# Patient Record
Sex: Male | Born: 1980 | Race: White | Hispanic: No | Marital: Married | Smoking: Never smoker
Health system: Southern US, Community
[De-identification: ages and names within clinical notes are randomized; demographics above are authoritative.]

## PROBLEM LIST (undated history)

## (undated) DIAGNOSIS — S92902A Unspecified fracture of left foot, initial encounter for closed fracture: Secondary | ICD-10-CM

## (undated) DIAGNOSIS — E785 Hyperlipidemia, unspecified: Secondary | ICD-10-CM

## (undated) DIAGNOSIS — J45909 Unspecified asthma, uncomplicated: Secondary | ICD-10-CM

## (undated) DIAGNOSIS — J309 Allergic rhinitis, unspecified: Secondary | ICD-10-CM

## (undated) HISTORY — DX: Unspecified fracture of left foot, initial encounter for closed fracture: S92.902A

## (undated) HISTORY — DX: Unspecified asthma, uncomplicated: J45.909

## (undated) HISTORY — DX: Allergic rhinitis, unspecified: J30.9

## (undated) HISTORY — DX: Hyperlipidemia, unspecified: E78.5

---

## 1999-10-13 DIAGNOSIS — S92902A Unspecified fracture of left foot, initial encounter for closed fracture: Secondary | ICD-10-CM

## 1999-10-13 HISTORY — DX: Unspecified fracture of left foot, initial encounter for closed fracture: S92.902A

## 2004-03-13 ENCOUNTER — Emergency Department (HOSPITAL_COMMUNITY): Admission: EM | Admit: 2004-03-13 | Discharge: 2004-03-13 | Payer: Self-pay | Admitting: Family Medicine

## 2005-07-31 ENCOUNTER — Ambulatory Visit: Payer: Self-pay | Admitting: Family Medicine

## 2005-08-31 ENCOUNTER — Ambulatory Visit: Payer: Self-pay | Admitting: Family Medicine

## 2005-08-31 LAB — CONVERTED CEMR LAB
RBC count: 4.88 10*6/uL
TSH: 1.52 microintl units/mL
WBC, blood: 6.5 10*3/uL

## 2006-01-29 ENCOUNTER — Ambulatory Visit: Payer: Self-pay | Admitting: Family Medicine

## 2006-08-12 ENCOUNTER — Ambulatory Visit: Payer: Self-pay | Admitting: Family Medicine

## 2006-11-04 ENCOUNTER — Ambulatory Visit: Payer: Self-pay | Admitting: Family Medicine

## 2006-11-18 ENCOUNTER — Ambulatory Visit: Payer: Self-pay | Admitting: Family Medicine

## 2007-02-12 ENCOUNTER — Emergency Department (HOSPITAL_COMMUNITY): Admission: EM | Admit: 2007-02-12 | Discharge: 2007-02-12 | Payer: Self-pay | Admitting: Emergency Medicine

## 2007-03-21 ENCOUNTER — Telehealth: Payer: Self-pay | Admitting: Family Medicine

## 2008-03-14 ENCOUNTER — Ambulatory Visit: Payer: Self-pay | Admitting: Family Medicine

## 2008-03-14 DIAGNOSIS — K224 Dyskinesia of esophagus: Secondary | ICD-10-CM | POA: Insufficient documentation

## 2008-03-20 ENCOUNTER — Ambulatory Visit: Payer: Self-pay | Admitting: Family Medicine

## 2008-03-21 ENCOUNTER — Encounter: Payer: Self-pay | Admitting: Family Medicine

## 2008-08-20 ENCOUNTER — Ambulatory Visit: Payer: Self-pay | Admitting: Family Medicine

## 2008-08-20 ENCOUNTER — Encounter: Payer: Self-pay | Admitting: Family Medicine

## 2008-08-20 DIAGNOSIS — F411 Generalized anxiety disorder: Secondary | ICD-10-CM | POA: Insufficient documentation

## 2008-08-20 DIAGNOSIS — E663 Overweight: Secondary | ICD-10-CM | POA: Insufficient documentation

## 2008-08-20 DIAGNOSIS — R7309 Other abnormal glucose: Secondary | ICD-10-CM | POA: Insufficient documentation

## 2008-08-20 DIAGNOSIS — E785 Hyperlipidemia, unspecified: Secondary | ICD-10-CM

## 2008-09-20 ENCOUNTER — Ambulatory Visit: Payer: Self-pay | Admitting: Family Medicine

## 2008-11-05 ENCOUNTER — Ambulatory Visit: Payer: Self-pay | Admitting: Family Medicine

## 2008-11-05 DIAGNOSIS — R1013 Epigastric pain: Secondary | ICD-10-CM

## 2008-12-31 ENCOUNTER — Ambulatory Visit: Payer: Self-pay | Admitting: Family Medicine

## 2009-07-24 ENCOUNTER — Ambulatory Visit: Payer: Self-pay | Admitting: Family Medicine

## 2009-07-24 DIAGNOSIS — M25519 Pain in unspecified shoulder: Secondary | ICD-10-CM | POA: Insufficient documentation

## 2009-07-24 DIAGNOSIS — M67919 Unspecified disorder of synovium and tendon, unspecified shoulder: Secondary | ICD-10-CM | POA: Insufficient documentation

## 2009-07-24 DIAGNOSIS — M719 Bursopathy, unspecified: Secondary | ICD-10-CM

## 2009-08-28 ENCOUNTER — Encounter: Payer: Self-pay | Admitting: Family Medicine

## 2009-09-11 ENCOUNTER — Ambulatory Visit: Payer: Self-pay | Admitting: Family Medicine

## 2009-10-10 ENCOUNTER — Telehealth: Payer: Self-pay | Admitting: Family Medicine

## 2010-05-20 ENCOUNTER — Encounter (INDEPENDENT_AMBULATORY_CARE_PROVIDER_SITE_OTHER): Payer: Self-pay | Admitting: *Deleted

## 2010-10-22 ENCOUNTER — Ambulatory Visit
Admission: RE | Admit: 2010-10-22 | Discharge: 2010-10-22 | Payer: Self-pay | Source: Home / Self Care | Attending: Family Medicine | Admitting: Family Medicine

## 2010-11-11 NOTE — Letter (Signed)
Summary: Nadara Eaton letter  [place] at [facility]  [address] [place], [place] [address]   Phone: [phone]  Fax: [phone]       05/20/2010 MRN: [id number]  [facility] [address] [place], [place]  [address]  Dear Mr. MCENROE,  [facility], and [facility] announce the retirement of Arta Silence, M.D., from full-time practice at the [facility] office effective April 10, 2010 and his plans of returning part-time.  It is important to Dr. Hetty Ely and to our practice that you understand that [facility] has seven physicians in our office for your health care needs.  We will continue to offer the same exceptional care that you have today.    Dr. Hetty Ely has spoken to many of you about his plans for retirement and returning part-time in the fall.   We will continue to work with you through the transition to schedule appointments for you in the office and meet the high standards that [place] is committed to.   Again, it is with great pleasure that we share the news that Dr. Hetty Ely will return to [facility] at [facility] in October of 2011 with a reduced schedule.    If you have any questions, or would like to request an appointment with one of our physicians, please call us at [phone] and press the option for Scheduling an appointment.  We take pleasure in providing you with excellent patient care and look forward to seeing you at your next office visit.  Our [facility] Physicians are:  Tillman Abide, M.D. Laurita Quint, M.D. Roxy Manns, M.D. Kerby Nora, M.D. Hannah Beat, M.D. Ruthe Mannan, M.D. We proudly welcomed Raechel Ache, M.D. and Eustaquio Boyden, M.D. to the practice in July/August 2011.  Sincerely,  [facility]

## 2010-11-13 NOTE — Assessment & Plan Note (Signed)
Summary: ABD PAIN/CLE   Vital Signs:  Patient profile:   30 year old male Weight:      302.75 pounds BMI:     43.60 Temp:     98.8 degrees F oral Pulse rate:   72 / minute Pulse rhythm:   regular BP sitting:   130 / 90  (left arm) Cuff size:   large  Vitals Entered By: Sydell Axon LPN (October 22, 2010 4:08 PM) CC: abdominal pain on left side of stomach and middle of chest   History of Present Illness: Pt here for "Air pocket pain" in epigastirci area just below the rib, pressure makes it worse, will not go away and sends out a "throb" occas...Marland Kitchenoff and on throughout the day. Then he also has in the center of the epigastric/LED area of "pain" like he hasn't eaten in three days and he has not been hungry. He feels fatigued from one or both.   Problems Prior to Update: 1)  Shoulder Pain, Right  (ICD-719.41) 2)  Rotator Cuff Syndrome, Right  (ICD-726.10) 3)  Abdominal Pain, Epigastric  (ICD-789.06) 4)  Hyperglycemia  (ICD-790.29) 5)  Overweight  (ICD-278.02) 6)  Hyperlipidemia  (ICD-272.4) 7)  Anxiety  (ICD-300.00) 8)  Verruca Vulgaris, Right Occip Scalp  (ICD-078.10) 9)  Esophageal Spasm  (ICD-530.5)  Medications Prior to Update: 1)  Vicodin 5-500 Mg Tabs (Hydrocodone-Acetaminophen) .... One Tab By Mouth Three Times A Day As Needed Pain.  Current Medications (verified): 1)  Zantac 75 75 Mg Tabs (Ranitidine Hcl) .... Take One By Mouth Daily As Needed 2)  Ibuprofen 200 Mg Tabs (Ibuprofen) .... As Needed  Allergies: 1)  ! * Lanolin Lotions  Physical Exam  General:  Well-developed,well-nourished,in no acute distress; alert,appropriate and cooperative throughout examination, overweight. Head:  Normocephalic and atraumatic without obvious abnormalities. No apparent alopecia or balding. Sinuses NT. Eyes:  Conjunctiva clear bilaterally.  Ears:  External ear exam shows no significant lesions or deformities.  Otoscopic examination reveals clear canals, tympanic membranes are  intact bilaterally without bulging, retraction, inflammation or discharge. Hearing is grossly normal bilaterally. Right TM dull to LR. Nose:  External nasal examination shows no deformity or inflammation. Nasal mucosa are pink and moist without lesions or exudates. Mouth:  Tonsils 3+ with exudates bilat. Neck:  No deformities, masses, but right sidedd ant cerv swelling and  tenderness noted. Lungs:  Normal respiratory effort, chest expands symmetrically. Lungs are clear to auscultation, no crackles or wheezes. Heart:  Normal rate and regular rhythm. S1 and S2 normal without gallop, murmur, click, rub or other extra sounds. Abdomen:  Bowel sounds positive,abdomen but focally tender at one discreet point 1cm or less in diam along left lat abd midway without masses, organomegaly or hernias noted.   Impression & Recommendations:  Problem # 1:  ABDOMINAL PAIN, EPIGASTRIC (ICD-789.06) Assessment Deteriorated  Presumed gastritis from GERD. Discussed acute vs chronic pain trmt. Start Maalox and Prophylactic diet. Start Prilosec two times a day for one week, then Zantac two times a day for one month. See me one month, sooner if sxs worsen.  Discussed symptom control with the patient.   Problem # 2:  UNSPECIFIED DISORDER OF INTESTINE (GAS) (ICD-569.9) Assessment: New Presumed discomfort from gasseous distention at the splenic flexure.  Trial of Simethicone regularly and avoiding gas producing food via clears and BRAT.  Complete Medication List: 1)  Zantac 75 75 Mg Tabs (Ranitidine hcl) .... Take one by mouth daily as needed 2)  Ibuprofen 200 Mg Tabs (Ibuprofen) .Marland KitchenMarland KitchenMarland Kitchen  As needed  Patient Instructions: 1)  RTC one month, sooner if sxs worsen. 2)  Avoid trigger foods.   Orders Added: 1)  Est. Patient Level III [04540]    Current Allergies (reviewed today): ! * LANOLIN LOTIONS

## 2010-12-25 ENCOUNTER — Ambulatory Visit (INDEPENDENT_AMBULATORY_CARE_PROVIDER_SITE_OTHER)
Admission: RE | Admit: 2010-12-25 | Discharge: 2010-12-25 | Disposition: A | Discharge status: Home / Self Care | Payer: Managed Care, Other (non HMO) | Source: Ambulatory Visit | Attending: Family Medicine | Admitting: Family Medicine

## 2010-12-25 ENCOUNTER — Other Ambulatory Visit: Payer: Self-pay | Admitting: Family Medicine

## 2010-12-25 ENCOUNTER — Encounter: Payer: Self-pay | Admitting: Family Medicine

## 2010-12-25 ENCOUNTER — Ambulatory Visit (INDEPENDENT_AMBULATORY_CARE_PROVIDER_SITE_OTHER): Payer: Managed Care, Other (non HMO) | Admitting: Family Medicine

## 2010-12-25 DIAGNOSIS — M79671 Pain in right foot: Secondary | ICD-10-CM

## 2010-12-25 DIAGNOSIS — M79609 Pain in unspecified limb: Secondary | ICD-10-CM | POA: Insufficient documentation

## 2010-12-31 ENCOUNTER — Telehealth: Payer: Self-pay | Admitting: *Deleted

## 2010-12-31 DIAGNOSIS — Z9109 Other allergy status, other than to drugs and biological substances: Secondary | ICD-10-CM

## 2010-12-31 MED ORDER — ALBUTEROL SULFATE HFA 108 (90 BASE) MCG/ACT IN AERS: 2.0000 | INHALATION_SPRAY | Freq: Four times a day (QID) | RESPIRATORY_TRACT | Status: AC | PRN

## 2010-12-31 MED ORDER — FLUTICASONE-SALMETEROL 100-50 MCG/DOSE IN AEPB: 1.0000 | INHALATION_SPRAY | Freq: Two times a day (BID) | RESPIRATORY_TRACT | Status: DC

## 2010-12-31 NOTE — Telephone Encounter (Signed)
Patient notified as instructed by telephone. 

## 2010-12-31 NOTE — Telephone Encounter (Signed)
Pt called with diff with allergies and requests an inhaler. Assume this is breathing and after discussing with Mom here in the clinic, it is indeed breathing difficulty. Will prescribe Ventolin for short term help and start on Advair for long term control. Have him see me next week, sooner if sxs don't respond. Ventolin prn no more than 4 times a day, Advair regularly bid but won't help for a few days.

## 2010-12-31 NOTE — Telephone Encounter (Signed)
Patient is having problems with his allergies. He is asking for an inhaler. He is having some trouble breathing. [facility] [address]
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
## 2011-01-10 ENCOUNTER — Encounter: Payer: Self-pay | Admitting: Family Medicine

## 2011-01-14 ENCOUNTER — Ambulatory Visit: Payer: Managed Care, Other (non HMO) | Admitting: Family Medicine

## 2011-09-08 ENCOUNTER — Ambulatory Visit (INDEPENDENT_AMBULATORY_CARE_PROVIDER_SITE_OTHER): Payer: Managed Care, Other (non HMO)

## 2011-09-08 DIAGNOSIS — Z23 Encounter for immunization: Secondary | ICD-10-CM

## 2012-03-14 ENCOUNTER — Ambulatory Visit (INDEPENDENT_AMBULATORY_CARE_PROVIDER_SITE_OTHER): Payer: BC Managed Care – PPO | Admitting: Family Medicine

## 2012-03-14 ENCOUNTER — Encounter: Payer: Self-pay | Admitting: Family Medicine

## 2012-03-14 ENCOUNTER — Other Ambulatory Visit: Payer: Self-pay

## 2012-03-14 VITALS — BP 120/72 | HR 64 | Temp 98.3°F | Ht 70.0 in | Wt 303.0 lb

## 2012-03-14 DIAGNOSIS — M6580 Other synovitis and tenosynovitis, unspecified site: Secondary | ICD-10-CM

## 2012-03-14 DIAGNOSIS — M659 Synovitis and tenosynovitis, unspecified: Secondary | ICD-10-CM

## 2012-03-14 DIAGNOSIS — Z13220 Encounter for screening for lipoid disorders: Secondary | ICD-10-CM

## 2012-03-14 DIAGNOSIS — J45909 Unspecified asthma, uncomplicated: Secondary | ICD-10-CM

## 2012-03-14 DIAGNOSIS — Z131 Encounter for screening for diabetes mellitus: Secondary | ICD-10-CM

## 2012-03-14 DIAGNOSIS — R5383 Other fatigue: Secondary | ICD-10-CM

## 2012-03-14 LAB — BASIC METABOLIC PANEL
BUN: 12 mg/dL (ref 6–23)
Calcium: 9.4 mg/dL (ref 8.4–10.5)
GFR: 109.53 mL/min (ref >60.00)
Glucose, Bld: 84 mg/dL (ref 70–99)
Potassium: 4.4 mEq/L (ref 3.5–5.1)

## 2012-03-14 LAB — CBC WITH DIFFERENTIAL/PLATELET
Eosinophils Relative: 6.9 % — ABNORMAL HIGH (ref 0.0–5.0)
HCT: 45.4 % (ref 39.0–52.0)
Lymphs Abs: 2.4 10*3/uL (ref 0.7–4.0)
Monocytes Relative: 8.9 % (ref 3.0–12.0)
Platelets: 244 10*3/uL (ref 150.0–400.0)
WBC: 7.5 10*3/uL (ref 4.5–10.5)

## 2012-03-14 LAB — TSH: TSH: 1.74 u[IU]/mL (ref 0.35–5.50)

## 2012-03-14 LAB — HEPATIC FUNCTION PANEL
AST: 26 U/L (ref 0–37)
Albumin: 4.2 g/dL (ref 3.5–5.2)

## 2012-03-14 MED ORDER — MONTELUKAST SODIUM 10 MG PO TABS: 10.0000 mg | ORAL_TABLET | Freq: Every day | ORAL | Status: AC

## 2012-03-14 NOTE — Progress Notes (Signed)
HEALTHCARE at [facility]  Patient Name: Ronald White Date of Birth: 16-Jul-1981 Medical Record Number: [id number] Gender: male Date of Encounter: 03/14/2012  History of Present Illness:  Ronald White is a 31 y.o. very pleasant male patient who presents with the following:  Seasonal allergies. Albuterol at least 2 times a day right now. He also has had some asthma in the past. No history of smoking.  Runny nose and itchy eyes. Claritin 24 hours, taking when going to sleep. Takes a  Hour pill. UTIs and been very significant. He is using either over-the-counter Claritin or Allegra, but not sure which one he is using.  He also has some a small knot on the lateral aspect of his ankle it is mild to minimally tender to palpation  Patient Active Problem List  Diagnoses  . HYPERLIPIDEMIA  . OVERWEIGHT  . ANXIETY  . ESOPHAGEAL SPASM  . SHOULDER PAIN, RIGHT  . ROTATOR CUFF SYNDROME, RIGHT  . ABDOMINAL PAIN, EPIGASTRIC  . HYPERGLYCEMIA  . FOOT PAIN, RIGHT   Past Medical History  Diagnosis Date  . Hyperlipemia   . Foot fracture, left 2001    severe trauma, multiple MT fractures   No past surgical history on file. History  Substance Use Topics  . Smoking status: Never Smoker   . Smokeless tobacco: Not on file  . Alcohol Use: Not on file   Family History  Problem Relation Age of Onset  . Diabetes Mother     Type 2  . Alcohol abuse Father   . Alcohol abuse Maternal Uncle   . Alcohol abuse Paternal Uncle   . Heart disease Maternal Grandmother     pacer  . Diabetes Maternal Grandmother    No Known Allergies  Medication list has been reviewed and updated.  Prior to Admission medications   Medication Sig Start Date End Date Taking? Authorizing Provider  fexofenadine (ALLEGRA) 180 MG tablet Take 180 mg by mouth daily.   Yes Historical Provider, MD  albuterol (PROVENTIL HFA;VENTOLIN HFA) 108 (90 BASE) MCG/ACT inhaler Inhale 2 puffs into the lungs every 6 (six) hours as  needed for wheezing (limit to 4 times a day.). 12/31/10 12/31/11  Arta Silence, MD  Fluticasone-Salmeterol (ADVAIR DISKUS) 100-50 MCG/DOSE AEPB Inhale 1 puff into the lungs 2 (two) times daily. 12/31/10 12/31/11  Arta Silence, MD    Review of Systems:   GEN: No acute illnesses, no fevers, chills. GI: No n/v/d, eating normally Pulm: No SOB Interactive and getting along well at home.  Otherwise, ROS is as per the HPI.   PE  GEN: WDWN, NAD, Non-toxic, A & O x 3 HEENT: Atraumatic, Normocephalic. Neck supple. No masses, No LAD. Ears and Nose: No external deformity. CV: RRR, No M/G/R. No JVD. No thrill. No extra heart sounds. PULM: CTA B, no wheezes, crackles, rhonchi. No retractions. No resp. distress. No accessory muscle use. EXTR: No c/c/e NEURO Normal gait.  PSYCH: Normally interactive. Conversant. Not depressed or anxious appearing.  Calm demeanor.    MSK: lateral ankle just inferior to the lateral malleolus there is a palpable nodular, or cystic area easily palpated. No bruising. All other bony anatomy nt  Body mass index is 43.48 kg/(m^2).  Diagnostic Ultrasound Evaluation Terason t3000, MSK ultrasound, MSK probe Anatomy scanned: Indication: Pain Findings: the LEFT lateral ankle skinned and with the probe and a longitudinal and transverse axis, the patient has a very significant amount of fluid surrounding his peritoneal tendon  Structures laterally  they correspond to the area of pain and enlargement. There appears to be no rupture or split in the tendon on patient   Assessment and Plan:  1. Asthma  montelukast (SINGULAIR) 10 MG tablet  2. Fatigue  Hepatic function panel, CBC with Differential, TSH  3. Screening for lipoid disorders  LDL cholesterol, direct  4. Screening for diabetes mellitus  Basic metabolic panel  5. Peroneal tenosynovitis      Start Singulair. Albuterol as needed for now. If he is not improving in the next 7-10 days, he will call and we can  start him inhaled corticosteroid.  He has no interest in starting nasal steroids.  Reassured him about the mild perineal tenosynovitis. Is not really causing him any dysfunction and is able to work without difficulty with it.  Orders Today: Orders Placed This Encounter  Procedures  . LDL cholesterol, direct  . Basic metabolic panel  . Hepatic function panel  . CBC with Differential  . TSH    Medications Today: Meds ordered this encounter  Medications  . fexofenadine (ALLEGRA) 180 MG tablet    Sig: Take 180 mg by mouth daily.  . montelukast (SINGULAIR) 10 MG tablet    Sig: Take 1 tablet (10 mg total) by mouth at bedtime.    Dispense:  30 tablet    Refill:  3     Hannah Beat, MD 03/14/2012 12:12 PM

## 2012-03-14 NOTE — Telephone Encounter (Signed)
Pt moved to [place]. Med refilled today went to [facility] [address]. Melissa at [address] said on hold and Kathryne Sharper can retrieve. Spoke with Addie at [facility] [phone] and they will get refills. Terri notified.

## 2012-03-14 NOTE — Patient Instructions (Signed)
Either Claritin or Allegra See which one works better  Call me in 10 days if the Singulair doesn't help at all

## 2012-03-15 ENCOUNTER — Encounter: Payer: Self-pay | Admitting: *Deleted
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
## 2012-07-14 ENCOUNTER — Encounter: Payer: Self-pay | Admitting: Family Medicine

## 2012-07-14 ENCOUNTER — Ambulatory Visit (INDEPENDENT_AMBULATORY_CARE_PROVIDER_SITE_OTHER): Payer: BC Managed Care – PPO | Admitting: Family Medicine

## 2012-07-14 VITALS — BP 118/64 | HR 73 | Temp 98.4°F | Ht 72.0 in | Wt 307.5 lb

## 2012-07-14 DIAGNOSIS — M25519 Pain in unspecified shoulder: Secondary | ICD-10-CM

## 2012-07-14 DIAGNOSIS — Z23 Encounter for immunization: Secondary | ICD-10-CM

## 2012-07-14 NOTE — Progress Notes (Signed)
[facility] conservation officer at [facility] [address] [place] [address] Phone: [phone] Fax: [phone]  Date:  07/14/2012   Name:  Ronald White   DOB:  22-Aug-1981   MRN:  [id number] Gender: male Age: 31 y.o.  PCP:  Eustaquio Boyden, MD    Chief Complaint: Shoulder Pain   History of Present Illness:  Ronald White is a 31 y.o. pleasant patient who presents with the following:  Now, moving his shoulder well, will hit just right.  Sharpness. On the right shoulder. For a while 8/10, but went to the beach and played the football on the beach, was hurting throwing. Then did not hurt and all the way across, everything popped.   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 He did do well with conservative management, and rotator cuff strengthening and scapular stabilization. Did do one subacromial injection several years ago.  Now the pain is different time he is having pain in a crossover type motion and he is having some pain when he is throwing.  Patient Active Problem List  Diagnosis  . HYPERLIPIDEMIA  . OVERWEIGHT  . ANXIETY  . ESOPHAGEAL SPASM  . FOOT PAIN, RIGHT    Past Medical History  Diagnosis Date  . Hyperlipemia   . Foot fracture, left 2001    severe trauma, multiple MT fractures    No past surgical history on file.  History  Substance Use Topics  . Smoking status: Never Smoker   . Smokeless tobacco: Not on file  . Alcohol Use: No    Family History  Problem Relation Age of Onset  . Diabetes Mother     Type 2  . Alcohol abuse Father   . Alcohol abuse Maternal Uncle   . Alcohol abuse Paternal Uncle   . Heart disease Maternal Grandmother     pacer  . Diabetes Maternal Grandmother     No Known Allergies  Medication list has been reviewed and updated.  Outpatient Prescriptions Prior to Visit  Medication Sig Dispense Refill  . fexofenadine (ALLEGRA) 180 MG tablet Take 180 mg by mouth daily.      . montelukast  (SINGULAIR) 10 MG tablet Take 1 tablet (10 mg total) by mouth at bedtime.  30 tablet  3  . albuterol (PROVENTIL HFA;VENTOLIN HFA) 108 (90 BASE) MCG/ACT inhaler Inhale 2 puffs into the lungs every 6 (six) hours as needed for wheezing (limit to 4 times a day.).  1 Inhaler  0    Review of Systems:   GEN: No fevers, chills. Nontoxic. Primarily MSK c/o today. MSK: Detailed in the HPI GI: tolerating PO intake without difficulty Neuro: No numbness, parasthesias, or tingling associated. Otherwise the pertinent positives of the ROS are noted above.    Physical Examination: Filed Vitals:   07/14/12 1607  BP: 118/64  Pulse: 73  Temp: 98.4 F (36.9 C)   Filed Vitals:   07/14/12 1607  Height: 6' (1.829 m)  Weight: 307 lb 8 oz (139.481 kg)   Body mass index is 41.70 kg/(m^2). Ideal Body Weight: Weight in (lb) to have BMI = 25: 183.9    GEN: WDWN, NAD, Non-toxic, Alert & Oriented x 3 HEENT: Atraumatic, Normocephalic.  Ears and Nose: No external deformity. EXTR: No clubbing/cyanosis/edema NEURO: Normal gait.  PSYCH: Normally interactive. Conversant. Not depressed or anxious appearing.  Calm demeanor.   Shoulder: R Inspection: No muscle wasting or winging Ecchymosis/edema: neg  AC joint, scapula, clavicle:  Mild tenderness  at [facility] joint Cervical spine: NT, full ROM Spurling's: neg Abduction: full, 5/5 Flexion: full, 5/5 IR, full, lift-off: 5/5 ER at neutral: full, 5/5 AC crossover and compression: POS Neer: neg Hawkins: neg Drop Test: neg Empty Can: POS Supraspinatus insertion: NT Bicipital groove: NT Speed's: POS Yergason's: neg CRANK NEG OBRIENS NEG Sulcus sign: neg Scapular dyskinesis: none C5-T1 intact Sensation intact Grip 5/5   Assessment and Plan: 1. Need for prophylactic vaccination and inoculation against influenza   2. Acromioclavicular joint pain     Isolates a.c. Joint. A.c. Joint inflammation with some supraspinatus tendinopathy that is mild  I  reassured him. Keep it below the nose. Rotator cuff strengthening and scapular stabilization. Anti-inflammatories.  Hannah Beat, MD

## 2013-01-27 ENCOUNTER — Ambulatory Visit (INDEPENDENT_AMBULATORY_CARE_PROVIDER_SITE_OTHER): Payer: BC Managed Care – PPO | Admitting: Family Medicine

## 2013-01-27 ENCOUNTER — Encounter: Payer: Self-pay | Admitting: Family Medicine

## 2013-01-27 VITALS — BP 112/78 | HR 78 | Temp 99.2°F | Wt 306.2 lb

## 2013-01-27 DIAGNOSIS — R21 Rash and other nonspecific skin eruption: Secondary | ICD-10-CM

## 2013-01-27 DIAGNOSIS — J309 Allergic rhinitis, unspecified: Secondary | ICD-10-CM

## 2013-01-27 DIAGNOSIS — J302 Other seasonal allergic rhinitis: Secondary | ICD-10-CM

## 2013-01-27 MED ORDER — FLUTICASONE PROPIONATE (INHAL) 250 MCG/BLIST IN AEPB: 1.0000 | INHALATION_SPRAY | Freq: Two times a day (BID) | RESPIRATORY_TRACT | Status: AC
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
## 2013-01-27 NOTE — Patient Instructions (Signed)
For skin - I think this is contact dermatitis, unsure to what. For allergies and chest - continue meds as up to now.  start nasal saline irrigation as needed throughout the day.   Let us know if not improving with this.

## 2013-01-27 NOTE — Assessment & Plan Note (Addendum)
Evident allergic rhinitis, worse seasonal. Discussed use of nasal saline irrigation. Continue singulair as well as antihistamine.    Expected PF for age and heigh - 45 Today's best was 490, 79% expected. Anticipate component of extrinsic asthma exacerbated by allergies. Start flovent diskus 1 puff bid.  Sample provided.  If not better, consider INS.

## 2013-01-27 NOTE — Assessment & Plan Note (Signed)
Anticipate contact dermatitis, steroid responsive, possibly to yellow ink. Treat with triamcinolone cream for next 2 weeks. Discussed moisturizer use.

## 2014-04-05 ENCOUNTER — Telehealth: Payer: Self-pay | Admitting: *Deleted

## 2014-04-05 NOTE — Telephone Encounter (Signed)
Ronald White called to see if he has had a Tdap.  His wife is expecting a baby and he wants to know if he needs to get a Tdap.  Advised it is highly recommended.

## 2014-06-14 ENCOUNTER — Ambulatory Visit (INDEPENDENT_AMBULATORY_CARE_PROVIDER_SITE_OTHER): Payer: BC Managed Care – PPO | Admitting: *Deleted

## 2014-06-14 DIAGNOSIS — Z23 Encounter for immunization: Secondary | ICD-10-CM
